# Patient Record
Sex: Male | Born: 1960 | Race: White | Hispanic: No | Marital: Married | Smoking: Never smoker
Health system: Southern US, Community
[De-identification: ages and names within clinical notes are randomized; demographics above are authoritative.]

## PROBLEM LIST (undated history)

## (undated) DIAGNOSIS — J302 Other seasonal allergic rhinitis: Secondary | ICD-10-CM

## (undated) DIAGNOSIS — I2510 Atherosclerotic heart disease of native coronary artery without angina pectoris: Secondary | ICD-10-CM

## (undated) HISTORY — PX: SURGERY SCROTAL / TESTICULAR: SUR1316

## (undated) HISTORY — DX: Other seasonal allergic rhinitis: J30.2

## (undated) HISTORY — DX: Atherosclerotic heart disease of native coronary artery without angina pectoris: I25.10

---

## 2017-12-23 DIAGNOSIS — H6593 Unspecified nonsuppurative otitis media, bilateral: Secondary | ICD-10-CM | POA: Diagnosis not present

## 2017-12-23 DIAGNOSIS — H9203 Otalgia, bilateral: Secondary | ICD-10-CM | POA: Diagnosis not present

## 2018-01-19 DIAGNOSIS — Z131 Encounter for screening for diabetes mellitus: Secondary | ICD-10-CM | POA: Diagnosis not present

## 2018-01-19 DIAGNOSIS — Z125 Encounter for screening for malignant neoplasm of prostate: Secondary | ICD-10-CM | POA: Diagnosis not present

## 2018-01-19 DIAGNOSIS — Z136 Encounter for screening for cardiovascular disorders: Secondary | ICD-10-CM | POA: Diagnosis not present

## 2018-01-19 DIAGNOSIS — Z0000 Encounter for general adult medical examination without abnormal findings: Secondary | ICD-10-CM | POA: Diagnosis not present

## 2018-09-23 DIAGNOSIS — R079 Chest pain, unspecified: Secondary | ICD-10-CM | POA: Diagnosis not present

## 2018-10-12 ENCOUNTER — Telehealth: Payer: Self-pay

## 2018-10-12 NOTE — Telephone Encounter (Signed)
SENT REFERRAL TO SCHEDULING AND FILED NOTES 

## 2018-10-13 NOTE — Progress Notes (Signed)
Chief Complaint  Patient presents with  . New Patient (Initial Visit)   History of Present Illness: 57 yo male with no past medical history here today as a new consult, referred by Dr. Joycelyn Rua for evaluation of chest pain. He has no known cardiac issues. He has been healthy and only takes Flonase for seasonal allergies. He is very active and rides his bike several days per week. His father had an MI at age 56. He has several uncles with CAD. He tells me that he has had chest pain for years. Usually it is sharp and lasts for a few seconds with no associated symptoms. One month ago he had an episode of substernal, sharp chest pain that lasted for 5 minutes. He felt dizzy and had diaphoresis. No dyspnea. This occurred at rest. He has had no exertional chest pain prior to this episode or since. He has never smoked.    Primary Care Physician: Joycelyn Rua, MD  Past Medical History:  Diagnosis Date  . Seasonal allergies     Past Surgical History:  Procedure Laterality Date  . SURGERY SCROTAL / TESTICULAR      Current Outpatient Medications  Medication Sig Dispense Refill  . fluticasone (FLONASE) 50 MCG/ACT nasal spray Place 1 spray into both nostrils daily.     No current facility-administered medications for this visit.     Allergies  Allergen Reactions  . Codeine     Social History   Socioeconomic History  . Marital status: Married    Spouse name: Not on file  . Number of children: 2  . Years of education: Not on file  . Highest education level: Not on file  Occupational History  . Occupation: Paediatric nurse  . Financial resource strain: Not on file  . Food insecurity:    Worry: Not on file    Inability: Not on file  . Transportation needs:    Medical: Not on file    Non-medical: Not on file  Tobacco Use  . Smoking status: Never Smoker  . Smokeless tobacco: Never Used  Substance and Sexual Activity  . Alcohol use: Yes    Comment: occasionally  .  Drug use: Never  . Sexual activity: Not on file    Comment: married  Lifestyle  . Physical activity:    Days per week: Not on file    Minutes per session: Not on file  . Stress: Not on file  Relationships  . Social connections:    Talks on phone: Not on file    Gets together: Not on file    Attends religious service: Not on file    Active member of club or organization: Not on file    Attends meetings of clubs or organizations: Not on file    Relationship status: Not on file  . Intimate partner violence:    Fear of current or ex partner: Not on file    Emotionally abused: Not on file    Physically abused: Not on file    Forced sexual activity: Not on file  Other Topics Concern  . Not on file  Social History Narrative  . Not on file    Family History  Problem Relation Age of Onset  . Heart attack Father 19  . Lymphoma Sister   . Other Sister        tumor around the heart  . Alzheimer's disease Mother     Review of Systems:  As stated in the HPI  and otherwise negative.   BP 108/70   Pulse (!) 50   Ht 6\' 1"  (1.854 m)   Wt 181 lb 12.8 oz (82.5 kg)   SpO2 98%   BMI 23.99 kg/m   Physical Examination: General: Well developed, well nourished, NAD  HEENT: OP clear, mucus membranes moist  SKIN: warm, dry. No rashes. Neuro: No focal deficits  Musculoskeletal: Muscle strength 5/5 all ext  Psychiatric: Mood and affect normal  Neck: No JVD, no carotid bruits, no thyromegaly, no lymphadenopathy.  Lungs:Clear bilaterally, no wheezes, rhonci, crackles Cardiovascular: Regular rate and rhythm. No murmurs, gallops or rubs. Abdomen:Soft. Bowel sounds present. Non-tender.  Extremities: No lower extremity edema. Pulses are 2 + in the bilateral DP/PT.  EKG:  EKG is ordered today. The ekg ordered today demonstrates Sinus bradycardia, rate 50 bpm.   Recent Labs: No results found for requested labs within last 8760 hours.   Lipid Panel No results found for: CHOL, TRIG, HDL,  CHOLHDL, VLDL, LDLCALC, LDLDIRECT   Wt Readings from Last 3 Encounters:  10/14/18 181 lb 12.8 oz (82.5 kg)     Other studies Reviewed: Additional studies/ records that were reviewed today include: . Review of the above records demonstrates:    Assessment and Plan:   1. Chest pain: His pain has mostly atypical features. Risk factors for CAD include FH of premature CAD. Will arrange coronary CTA to exclude CAD. Will arrange echo to assess LVEF and exclude structural heart disease.   Current medicines are reviewed at length with the patient today.  The patient does not have concerns regarding medicines.  The following changes have been made:  no change  Labs/ tests ordered today include:   Orders Placed This Encounter  Procedures  . CT CORONARY MORPH W/CTA COR W/SCORE W/CA W/CM &/OR WO/CM  . CT CORONARY FRACTIONAL FLOW RESERVE DATA PREP  . CT CORONARY FRACTIONAL FLOW RESERVE FLUID ANALYSIS  . Basic Metabolic Panel (BMET)  . EKG 12-Lead  . ECHOCARDIOGRAM COMPLETE     Disposition:   FU with me  in 8 weeks   Signed, Verne Carrowhristopher Alyanna Stoermer, MD 10/14/2018 9:48 AM    [facility] [address], [place], [place]  [address] Phone: [phone]; Fax: [phone]

## 2018-10-14 ENCOUNTER — Ambulatory Visit (INDEPENDENT_AMBULATORY_CARE_PROVIDER_SITE_OTHER): Payer: 59 | Admitting: Cardiovascular Disease

## 2018-10-14 ENCOUNTER — Encounter (INDEPENDENT_AMBULATORY_CARE_PROVIDER_SITE_OTHER): Payer: Self-pay

## 2018-10-14 ENCOUNTER — Encounter: Payer: Self-pay | Admitting: Cardiovascular Disease

## 2018-10-14 VITALS — BP 108/70 | HR 50 | Ht 73.0 in | Wt 181.8 lb

## 2018-10-14 DIAGNOSIS — R072 Precordial pain: Secondary | ICD-10-CM | POA: Diagnosis not present

## 2018-10-14 LAB — BASIC METABOLIC PANEL
BUN/Creatinine Ratio: 12 (ref 9–20)
BUN: 12 mg/dL (ref 6–24)
CO2: 25 mmol/L (ref 20–29)
CREATININE: 1 mg/dL (ref 0.76–1.27)
Calcium: 9.5 mg/dL (ref 8.7–10.2)
Chloride: 99 mmol/L (ref 96–106)
GFR calc non Af Amer: 83 mL/min/{1.73_m2} (ref >59)
GFR, EST AFRICAN AMERICAN: 95 mL/min/{1.73_m2} (ref >59)
Glucose: 86 mg/dL (ref 65–99)
Potassium: 4.4 mmol/L (ref 3.5–5.2)
Sodium: 138 mmol/L (ref 134–144)

## 2018-10-14 NOTE — Patient Instructions (Addendum)
Medication Instructions:  Your physician recommends that you continue on your current medications as directed. Please refer to the Current Medication list given to you today.  If you need a refill on your cardiac medications before your next appointment, please call your pharmacy.   Lab work: Lab work to be done today--BMP If you have labs (blood work) drawn today and your tests are completely normal, you will receive your results only by: Marland Kitchen. MyChart Message (if you have MyChart) OR . A paper copy in the mail If you have any lab test that is abnormal or we need to change your treatment, we will call you to review the results.  Testing/Procedures: Your physician has requested that you have an echocardiogram. Echocardiography is a painless test that uses sound waves to create images of your heart. It provides your doctor with information about the size and shape of your heart and how well your heart's chambers and valves are working. This procedure takes approximately one hour. There are no restrictions for this procedure.  Your physician has requested that you have cardiac CT. Cardiac computed tomography (CT) is a painless test that uses an x-ray machine to take clear, detailed pictures of your heart. For further information please visit [email or web address]. Please follow instruction sheet as given.    Follow-Up  Your physician recommends that you schedule a follow-up appointment VH:QIONGin:about 8 weeks. --Scheduled for January 30,2020 at 2:00    Any Other Special Instructions Will Be Listed Below (If Applicable). Please [facility] the [facility] at   AM (30-45 minutes prior to test start time)  [facility]  [address] [place], [place] [address] [phone]  Proceed to the [facility] Rad[facility] Department (First Floor).  Please follow these instructions carefully (unless otherwise directed):  Hold all erectile dysfunction medications  at least 48 hours prior to test.  On the Night Before the Test: . Be sure to Drink plenty of water. . Do not consume any caffeinated/decaffeinated beverages or chocolate 12 hours prior to your test. . Do not take any antihistamines 12 hours prior to your test. . If you take Metformin do not take 24 hours prior to test.   On the Day of the Test: . Drink plenty of water. Do not drink any water within one hour of the test. . Do not eat any food 4 hours prior to the test. . You may take your regular medications prior to the test.          After the Test: . Drink plenty of water. . After receiving IV contrast, you may experience a mild flushed feeling. This is normal. . On occasion, you may experience a mild rash up to 24 hours after the test. This is not dangerous. If this occurs, you can take Benadryl 25 mg and increase your fluid intake. . If you experience trouble breathing, this can be serious. If it is severe call 911 IMMEDIATELY. If it is mild, please call our office. . If you take any of these medications: Glipizide/Metformin, Avandament, Glucavance, please do not take 48 hours after completing test.

## 2018-10-16 ENCOUNTER — Ambulatory Visit (HOSPITAL_COMMUNITY): Payer: 59 | Attending: Cardiology

## 2018-10-16 ENCOUNTER — Other Ambulatory Visit: Payer: Self-pay

## 2018-10-16 DIAGNOSIS — R072 Precordial pain: Secondary | ICD-10-CM | POA: Diagnosis present

## 2018-11-18 ENCOUNTER — Telehealth (HOSPITAL_COMMUNITY): Payer: Self-pay | Admitting: Emergency Medicine

## 2018-11-18 NOTE — Telephone Encounter (Signed)
Reaching out to patient to offer assistance regarding upcoming cardiac imaging study; pt verbalizes understanding of appt date/time, parking situation and where to check in, pre-test NPO status and medications ordered, and verified current allergies; name and call back number provided for further questions should they arise Loyola Santino RN Navigator Cardiac Imaging [phone] 

## 2018-11-20 ENCOUNTER — Ambulatory Visit (HOSPITAL_COMMUNITY)
Admission: RE | Admit: 2018-11-20 | Discharge: 2018-11-20 | Disposition: A | Discharge status: Home / Self Care | Payer: 59 | Source: Ambulatory Visit | Attending: Cardiovascular Disease | Admitting: Cardiovascular Disease

## 2018-11-20 ENCOUNTER — Ambulatory Visit (HOSPITAL_COMMUNITY): Admission: RE | Admit: 2018-11-20 | Payer: 59 | Source: Ambulatory Visit

## 2018-11-20 DIAGNOSIS — R072 Precordial pain: Secondary | ICD-10-CM | POA: Insufficient documentation

## 2018-11-20 DIAGNOSIS — Z006 Encounter for examination for normal comparison and control in clinical research program: Secondary | ICD-10-CM

## 2018-11-20 MED ORDER — NITROGLYCERIN 0.4 MG SL SUBL
0.8000 mg | SUBLINGUAL_TABLET | Freq: Once | SUBLINGUAL | Status: AC
  Administered 2018-11-20: 0.8 mg via SUBLINGUAL

## 2018-11-20 MED ORDER — IOPAMIDOL (ISOVUE-370) INJECTION 76%
INTRAVENOUS | Status: AC
  Administered 2018-11-20: 80 mL via INTRAVENOUS
  Filled 2018-11-20: qty 100

## 2018-11-20 MED ORDER — NITROGLYCERIN 0.4 MG SL SUBL
SUBLINGUAL_TABLET | SUBLINGUAL | Status: AC
  Filled 2018-11-20: qty 2

## 2018-11-20 NOTE — Research (Signed)
Cody Mcmahon met inclusion and exclusion criteria.  The informed consent form, study requirements and expectations were reviewed with the subject and questions and concerns were addressed prior to the signing of the consent form.  The subject verbalized understanding of the trial requirements.  The subject agreed to participate in the CADFEM trial and signed the informed consent.  The informed consent was obtained prior to performance of any protocol-specific procedures for the subject.  A copy of the signed informed consent was given to the subject and a copy was placed in the subject's medical record.   Angela M. Smith Research Assistant 

## 2018-11-23 ENCOUNTER — Telehealth: Payer: Self-pay | Admitting: *Deleted

## 2018-11-23 MED ORDER — ASPIRIN EC 81 MG PO TBEC: 81.0000 mg | DELAYED_RELEASE_TABLET | Freq: Every day | ORAL | 3 refills | Status: AC

## 2018-11-23 NOTE — Telephone Encounter (Signed)
-----   Message from Kathleene Hazel, MD sent at 11/23/2018 11:30 AM EST ----- Very mild plaque in the coronary arteries. I would recommend starting ASA 81 mg daily and Crestor 5 mg daily. He will need a fasting lipid profile and LFTs in 12 weeks. chris

## 2018-11-23 NOTE — Telephone Encounter (Signed)
I spoke with pt and reviewed results and recommendations from Dr. Clifton James with him.  Pt would like to talk with Dr. Clifton James about having lab work done prior to starting Rosuvastatin.  He has office visit on December 10, 2018 and will talk with Dr. Clifton James at that time.

## 2018-12-10 ENCOUNTER — Ambulatory Visit (INDEPENDENT_AMBULATORY_CARE_PROVIDER_SITE_OTHER): Payer: 59 | Admitting: Cardiovascular Disease

## 2018-12-10 ENCOUNTER — Encounter: Payer: Self-pay | Admitting: Cardiovascular Disease

## 2018-12-10 VITALS — BP 113/68 | HR 63 | Ht 73.0 in | Wt 182.0 lb

## 2018-12-10 DIAGNOSIS — I2510 Atherosclerotic heart disease of native coronary artery without angina pectoris: Secondary | ICD-10-CM | POA: Diagnosis not present

## 2018-12-10 MED ORDER — ROSUVASTATIN CALCIUM 5 MG PO TABS: 5.0000 mg | ORAL_TABLET | Freq: Every day | ORAL | 3 refills | Status: DC

## 2018-12-10 NOTE — Progress Notes (Signed)
Chief Complaint  Patient presents with  . Follow-up    chest pain   History of Present Illness: 58 yo male with no past medical history who I saw as a new consult 10/14/18 for evaluation of chest pain. No known heart issues. He is very active and rides his bike several days per week. His father had an MI at age 59. He has several uncles with CAD. He tells me that he has had chest pain for years. Usually it is sharp and lasts for a few seconds with no associated symptoms. One month ago he had an episode of substernal, sharp chest pain that lasted for 5 minutes. He felt dizzy and had diaphoresis. No dyspnea. This occurred at rest. He has had no exertional chest pain prior to this episode or since. He has never smoked.  Coronary CTA 11/20/18 with mild plaque in the LAD. No plaque in the RCA or Circumflex. Calcium score of 25. Echo 10/16/18 with LVEF=55-60%. Right atrial enlargement. No significant valve disease.   He is here today for follow up. The patient denies any chest pain, dyspnea, palpitations, lower extremity edema, orthopnea, PND, dizziness, near syncope or syncope.    Primary Care Physician: Joycelyn RuaMeyers, Stephen, MD  Past Medical History:  Diagnosis Date  . CAD (coronary artery disease)    Mild plaque in LAD on coronary CTA January 2020  . Seasonal allergies     Past Surgical History:  Procedure Laterality Date  . SURGERY SCROTAL / TESTICULAR      Current Outpatient Medications  Medication Sig Dispense Refill  . aspirin EC 81 MG tablet Take 1 tablet (81 mg total) by mouth daily. 90 tablet 3  . fluticasone (FLONASE) 50 MCG/ACT nasal spray Place 1 spray into both nostrils daily.    . rosuvastatin (CRESTOR) 5 MG tablet Take 1 tablet (5 mg total) by mouth daily. 90 tablet 3   No current facility-administered medications for this visit.     Allergies  Allergen Reactions  . Codeine     Social History   Socioeconomic History  . Marital status: Married    Spouse name: Not on  file  . Number of children: 2  . Years of education: Not on file  . Highest education level: Not on file  Occupational History  . Occupation: Paediatric nursengineer  Social Needs  . Financial resource strain: Not on file  . Food insecurity:    Worry: Not on file    Inability: Not on file  . Transportation needs:    Medical: Not on file    Non-medical: Not on file  Tobacco Use  . Smoking status: Never Smoker  . Smokeless tobacco: Never Used  Substance and Sexual Activity  . Alcohol use: Yes    Comment: occasionally  . Drug use: Never  . Sexual activity: Not on file    Comment: married  Lifestyle  . Physical activity:    Days per week: Not on file    Minutes per session: Not on file  . Stress: Not on file  Relationships  . Social connections:    Talks on phone: Not on file    Gets together: Not on file    Attends religious service: Not on file    Active member of club or organization: Not on file    Attends meetings of clubs or organizations: Not on file    Relationship status: Not on file  . Intimate partner violence:    Fear of current or ex partner:  Not on file    Emotionally abused: Not on file    Physically abused: Not on file    Forced sexual activity: Not on file  Other Topics Concern  . Not on file  Social History Narrative  . Not on file    Family History  Problem Relation Age of Onset  . Heart attack Father 7260  . Lymphoma Sister   . Other Sister        tumor around the heart  . Alzheimer's disease Mother     Review of Systems:  As stated in the HPI and otherwise negative.   BP 113/68   Pulse 63   Ht 6\' 1"  (1.854 m)   Wt 182 lb (82.6 kg)   SpO2 98%   BMI 24.01 kg/m   Physical Examination: General: Well developed, well nourished, NAD  HEENT: OP clear, mucus membranes moist  SKIN: warm, dry. No rashes. Neuro: No focal deficits  Musculoskeletal: Muscle strength 5/5 all ext  Psychiatric: Mood and affect normal  Neck: No JVD, no carotid bruits, no  thyromegaly, no lymphadenopathy.  Lungs:Clear bilaterally, no wheezes, rhonci, crackles Cardiovascular: Regular rate and rhythm. No murmurs, gallops or rubs. Abdomen:Soft. Bowel sounds present. Non-tender.  Extremities: No lower extremity edema. Pulses are 2 + in the bilateral DP/PT.  Echo 10/16/18: Left ventricle: The cavity size was mildly dilated. Systolic   function was normal. The estimated ejection fraction was in the   range of 55% to 60%. Wall motion was normal; there were no   regional wall motion abnormalities. Features are consistent with   a pseudonormal left ventricular filling pattern, with concomitant   abnormal relaxation and increased filling pressure (grade 2   diastolic dysfunction). GLS: -22.7% - Mitral valve: Mild prolapse, involving the anterior leaflet. - Right ventricle: The cavity size was mildly dilated. Wall   thickness was normal. - Right atrium: The atrium was severely dilated. - Tricuspid valve: There was mild regurgitation.   EKG:  EKG is not ordered today. The ekg ordered today demonstrates   Recent Labs: 10/14/2018: BUN 12; Creatinine, Ser 1.00; Potassium 4.4; Sodium 138   Lipid Panel No results found for: CHOL, TRIG, HDL, CHOLHDL, VLDL, LDLCALC, LDLDIRECT   Wt Readings from Last 3 Encounters:  12/10/18 182 lb (82.6 kg)  10/14/18 181 lb 12.8 oz (82.5 kg)     Other studies Reviewed: Additional studies/ records that were reviewed today include: . Review of the above records demonstrates:    Assessment and Plan:   1. CAD/Chest pain: Mild plaque in the LAD noted on coronary CTA. LV function normal on echo. His chest pain is not felt to be cardiac related.  Given mild CAD noted on Coronary CTA, I will have him start ASA 81 mg daily and Crestor 5 mg daily. Fasting lipids and LFTS in 12 weeks.   Current medicines are reviewed at length with the patient today.  The patient does not have concerns regarding medicines.  The following changes have  been made:  no change  Labs/ tests ordered today include:   Orders Placed This Encounter  Procedures  . Lipid panel  . Hepatic function panel     Disposition:   FU with me  in 12 months    Signed, Verne Carrowhristopher Keirsten Matuska, MD 12/10/2018 2:28 PM    [facility] [address], [place], [place]  [address] Phone: [phone]; Fax: [phone]

## 2018-12-10 NOTE — Patient Instructions (Signed)
Medication Instructions:  1) START Rosuvastatin 5mg  once daily  If you need a refill on your cardiac medications before your next appointment, please call your pharmacy.   Lab work: Your physician recommends that you return for lab work in: 12 weeks (Lipid, liver)  If you have labs (blood work) drawn today and your tests are completely normal, you will receive your results only by: Marland Kitchen MyChart Message (if you have MyChart) OR . A paper copy in the mail If you have any lab test that is abnormal or we need to change your treatment, we will call you to review the results.  Testing/Procedures: None  Follow-Up: At [facility], you and your health needs are our priority.  As part of our continuing mission to provide you with exceptional heart care, we have created designated Provider Care Teams.  These Care Teams include your primary Cardiologist (physician) and Advanced Practice Providers (APPs -  Physician Assistants and Nurse Practitioners) who all work together to provide you with the care you need, when you need it. You will need a follow up appointment in 12 months.  Please call our office 2 months in advance to schedule this appointment.  You may see Verne Carrow, MD or one of the following Advanced Practice Providers on your designated Care Team:   [place], PA-C Ronie Spies, PA-C . Jacolyn Reedy, PA-C  Any Other Special Instructions Will Be Listed Below (If Applicable).

## 2018-12-14 DIAGNOSIS — E7800 Pure hypercholesterolemia, unspecified: Secondary | ICD-10-CM | POA: Diagnosis not present

## 2018-12-24 DIAGNOSIS — J019 Acute sinusitis, unspecified: Secondary | ICD-10-CM | POA: Diagnosis not present

## 2019-02-15 IMAGING — CT CT HEART MORP W/ CTA COR W/ SCORE W/ CA W/CM &/OR W/O CM
4 of 7 series · 8 of 20 positions shown, 9 images · IV contrast (APPLIED)
Comparison: None.

Addendum:
EXAM:
OVER-READ INTERPRETATION  CT CHEST

The following report is an over-read performed by radiologist Dr.
Gyrit Swaak [REDACTED] on 11/20/2018. This
over-read does not include interpretation of cardiac or coronary
anatomy or pathology. The coronary calcium score/coronary CTA
interpretation by the cardiologist is attached.
CLINICAL DATA: Chest pain
Cardiac CTA
MEDICATIONS:
Sub lingual nitro. 4mg x 2
TECHNIQUE: The patient was scanned on a Siemens [REDACTED]ice scanner. Gantry
rotation speed was 250 msecs. Collimation was 0.6 mm. A 100 kV
prospective scan was triggered in the ascending thoracic aorta at
35-75% of the R-R interval. Average HR during the scan was 60 bpm.
The 3D data set was interpreted on a dedicated work station using
MPR, MIP and VRT modes. A total of 80cc of contrast was used.

[Series 6: best diast 76 % · axial · 0.36mm/px · z∈[+1160,+1215]mm · 2 of 412 slices shown, 3 images]
[im 138/412  vessel]
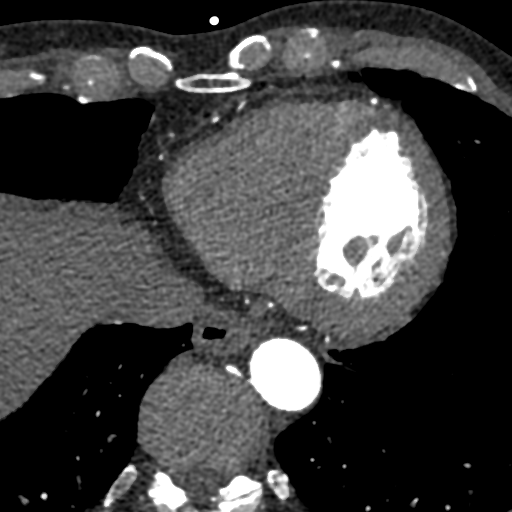
[im 138/412  lung]
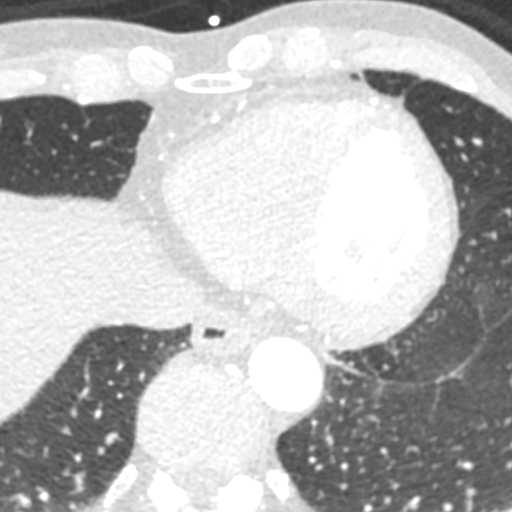
[im 275/412  vessel]
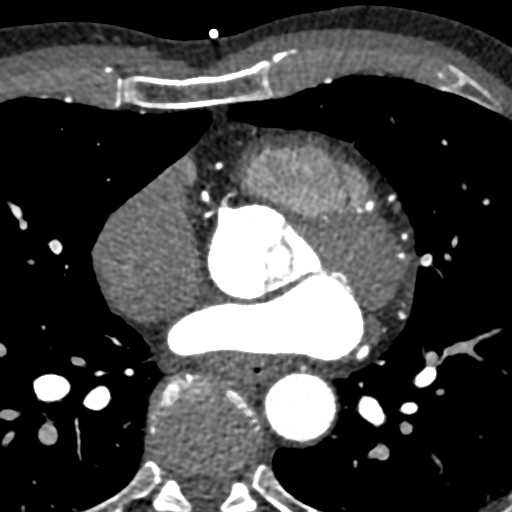

[Series 7: best syst 30 % · axial · 0.36mm/px · z∈[+1160,+1215]mm · 2 of 412 slices shown]
[im 138/412  vessel]
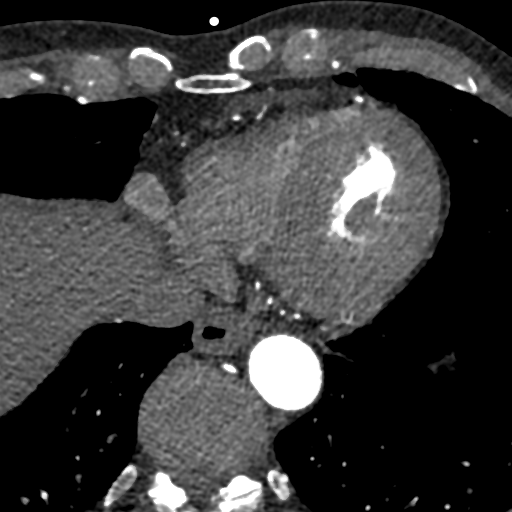
[im 275/412  vessel]
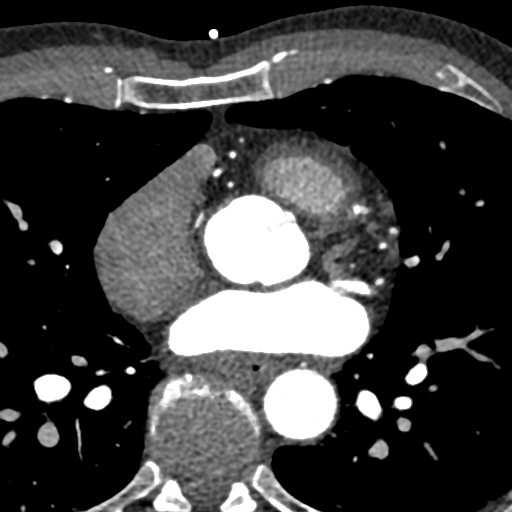

[Series 8: ts diast sharp 76 % · axial · 0.36mm/px · z∈[+1160,+1215]mm · 2 of 412 slices shown]
[im 138/412  lung]
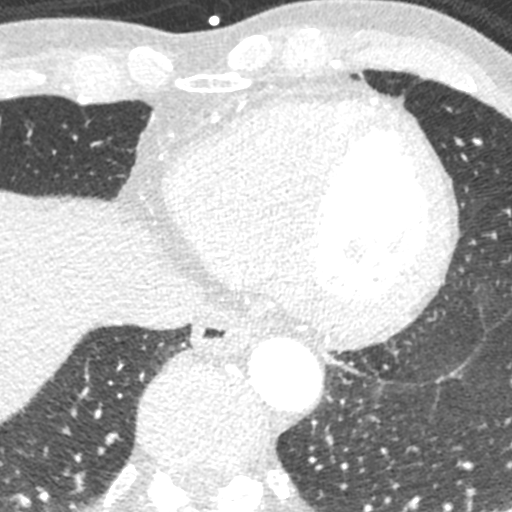
[im 275/412  lung]
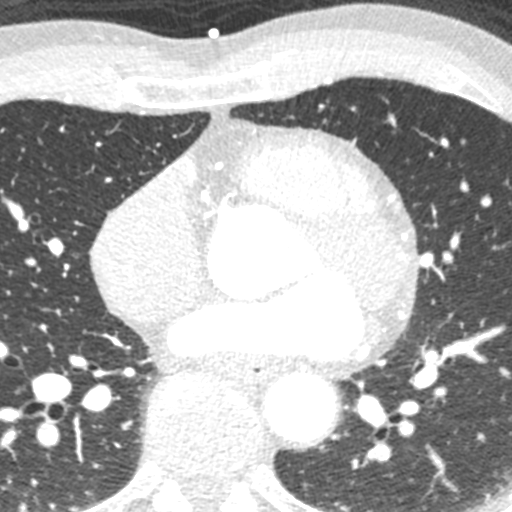

[Series 9: ts syst sharp 30 % · axial · 0.36mm/px · z∈[+1160,+1215]mm · 2 of 412 slices shown]
[im 138/412  lung]
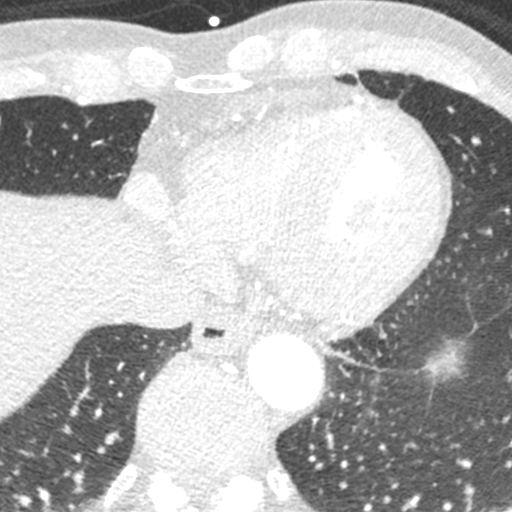
[im 275/412  lung]
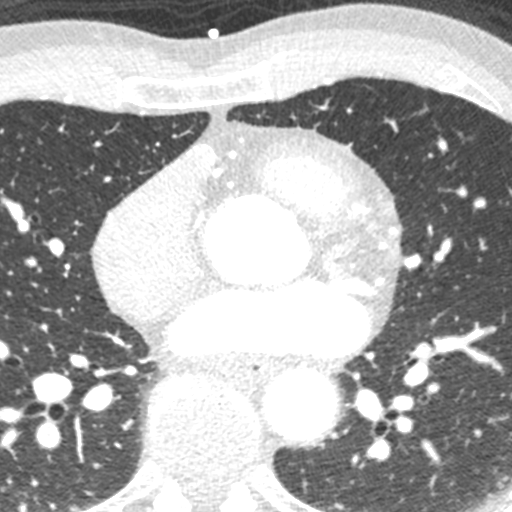

[8 of 20 positions shown; findings below may reference images not displayed]

FINDINGS: Within the visualized portions of the thorax there are no suspicious
appearing pulmonary nodules or masses, there is no acute
consolidative airspace disease, no pleural effusions, no
pneumothorax and no lymphadenopathy. Prominent but nonenlarged right
hilar lymph nodes are incidentally noted (nonspecific). Visualized
portions of the upper abdomen are unremarkable. There are no
aggressive appearing lytic or blastic lesions noted in the
visualized portions of the skeleton.
IMPRESSION: No significant incidental noncardiac findings are noted.
FINDINGS: Non-cardiac: See separate report from [REDACTED].

Pulmonary veins drain normally to the left atrium.

Calcium Score: 25 Agatston units.

Coronary Arteries: Left dominant with no anomalies

LM: No plaque or stenosis.

LAD system: Mixed plaque proximal LAD with mild stenosis (<50%).
Calcified plaque at the ostium of a moderate 1st diagonal, mild
(<50%) stenosis.

Circumflex system: Large vessel providing left PDA. No plaque or
stenosis.

RCA system: Small, nondominant vessel.  No plaque or stenosis.
IMPRESSION: 1.Coronary artery calcium score 25 Agatston units. This places the
patient in the 53rd percentile for age and gender, suggesting
intermediate risk for future cardiac events.

2.  Nonobstructive CAD.

Jiovahny Baroulette

*** End of Addendum ***

## 2019-03-26 ENCOUNTER — Other Ambulatory Visit: Payer: 59 | Admitting: *Deleted

## 2019-03-26 ENCOUNTER — Encounter (INDEPENDENT_AMBULATORY_CARE_PROVIDER_SITE_OTHER): Payer: Self-pay

## 2019-03-26 ENCOUNTER — Other Ambulatory Visit: Payer: Self-pay

## 2019-03-26 DIAGNOSIS — I2510 Atherosclerotic heart disease of native coronary artery without angina pectoris: Secondary | ICD-10-CM

## 2019-03-26 LAB — HEPATIC FUNCTION PANEL
ALT: 31 IU/L (ref 0–44)
AST: 27 IU/L (ref 0–40)
Albumin: 4.9 g/dL (ref 3.8–4.9)
Alkaline Phosphatase: 37 IU/L — ABNORMAL LOW (ref 39–117)
Bilirubin Total: 0.9 mg/dL (ref 0.0–1.2)
Bilirubin, Direct: 0.23 mg/dL (ref 0.00–0.40)
Total Protein: 7.7 g/dL (ref 6.0–8.5)

## 2019-03-31 DIAGNOSIS — I2510 Atherosclerotic heart disease of native coronary artery without angina pectoris: Secondary | ICD-10-CM

## 2019-03-31 DIAGNOSIS — E7849 Other hyperlipidemia: Secondary | ICD-10-CM

## 2019-03-31 LAB — SPECIMEN STATUS REPORT

## 2019-03-31 LAB — LIPID PANEL W/O CHOL/HDL RATIO
Cholesterol, Total: 196 mg/dL (ref 100–199)
HDL: 85 mg/dL (ref >39)
LDL Calculated: 95 mg/dL (ref 0–99)
Triglycerides: 78 mg/dL (ref 0–149)
VLDL Cholesterol Cal: 16 mg/dL (ref 5–40)

## 2019-04-01 MED ORDER — ROSUVASTATIN CALCIUM 10 MG PO TABS: 10.0000 mg | ORAL_TABLET | Freq: Every day | ORAL | 3 refills | Status: AC

## 2019-06-24 ENCOUNTER — Other Ambulatory Visit: Payer: 59 | Admitting: *Deleted

## 2019-06-24 ENCOUNTER — Other Ambulatory Visit: Payer: Self-pay

## 2019-06-24 DIAGNOSIS — E7849 Other hyperlipidemia: Secondary | ICD-10-CM

## 2019-06-24 DIAGNOSIS — I2510 Atherosclerotic heart disease of native coronary artery without angina pectoris: Secondary | ICD-10-CM

## 2019-06-24 LAB — HEPATIC FUNCTION PANEL
ALT: 19 IU/L (ref 0–44)
AST: 24 IU/L (ref 0–40)
Albumin: 4.7 g/dL (ref 3.8–4.9)
Alkaline Phosphatase: 32 IU/L — ABNORMAL LOW (ref 39–117)
Bilirubin Total: 0.7 mg/dL (ref 0.0–1.2)
Bilirubin, Direct: 0.19 mg/dL (ref 0.00–0.40)
Total Protein: 7.4 g/dL (ref 6.0–8.5)

## 2019-06-24 LAB — LIPID PANEL
Chol/HDL Ratio: 2 ratio (ref 0.0–5.0)
Cholesterol, Total: 177 mg/dL (ref 100–199)
HDL: 88 mg/dL (ref >39)
LDL Calculated: 78 mg/dL (ref 0–99)
Triglycerides: 53 mg/dL (ref 0–149)
VLDL Cholesterol Cal: 11 mg/dL (ref 5–40)

## 2019-11-23 ENCOUNTER — Ambulatory Visit (INDEPENDENT_AMBULATORY_CARE_PROVIDER_SITE_OTHER): Payer: Commercial Managed Care - PPO | Admitting: Family Medicine

## 2019-11-23 ENCOUNTER — Encounter: Payer: Self-pay | Admitting: Family Medicine

## 2019-11-23 ENCOUNTER — Other Ambulatory Visit: Payer: Self-pay

## 2019-11-23 DIAGNOSIS — S62525B Nondisplaced fracture of distal phalanx of left thumb, initial encounter for open fracture: Secondary | ICD-10-CM

## 2019-11-23 DIAGNOSIS — S61012A Laceration without foreign body of left thumb without damage to nail, initial encounter: Secondary | ICD-10-CM | POA: Diagnosis not present

## 2019-11-23 NOTE — Progress Notes (Signed)
Cody Mcmahon - 59 y.o. male MRN [id number]  Date of birth: 10-28-1961  Office Visit Note: Visit Date: 11/23/2019 PCP: Orpah Melter, MD Referred by: Orpah Melter, MD  Subjective: Chief Complaint  Patient presents with  . Left Thumb - Pain, Injury    Sawblade laceration 11/21/19 - was seen at [facility] urgent care in [place]. Sutured and bandaged after taking xrays.   HPI: Cody Mcmahon is a 59 y.o. male who comes in today with injury to left thumb after he cut his thumb on a table saw saw on 11/21/2019. He sustained tuft fracture with laceration of his left thumb. He was seen at [facility] ED in [place]. Laceration repaired with 5 sutures and he was prescribed 10 days keflex. He received Tdap vaccination in the ED.  Pain has been well tolerated without medications. Able to move thumb. No change in sensation.  ROS Otherwise per HPI.  Assessment & Plan: Visit Diagnoses:  1. Laceration of left thumb without foreign body without damage to nail, initial encounter   2. Nondisplaced fracture of distal phalanx of left thumb, initial encounter for open fracture    Laceration s/p repair, well healing with no sign of infection or tendon involvement. Return in 1 week for suture removal, repeat x-ray to ensure appropriate alignment of fracture  Meds & Orders: No orders of the defined types were placed in this encounter.  No orders of the defined types were placed in this encounter.   Follow-up: Return in about 1 week (around 11/30/2019).   Procedures: No procedures performed  No notes on file   Clinical History: No specialty comments available.   He reports that he has never smoked. He has never used smokeless tobacco. No results for input(s): HGBA1C, LABURIC in the last 8760 hours.  Objective:  VS:  HT:    WT:   BMI:     BP:   HR: bpm  TEMP: ( )  RESP:  Physical Exam  PHYSICAL EXAM: Gen: NAD, alert, cooperative with exam, well-appearing HEENT: clear conjunctiva,  CV:  no  edema, capillary refill brisk, normal rate Resp: non-labored Skin: no rashes, normal turgor  Neuro: no gross deficits.  Psych:  alert and oriented  Ortho Exam  Inspection: L thumb with 5 sutures on volar and ulnar surface of distal phalanx, well healing. Surrounding area is pink. No drainage Palpation: did not palpate over repaired laceration.  ROM: able to fully bend and flex distal thumb Strength: flexor and extensor tendons intact Sensation intact. Thumb neurovascularly intact  Imaging: No results found.  Past Medical/Family/Surgical/Social History: Medications & Allergies reviewed per EMR, new medications updated. There are no problems to display for this patient.  Past Medical History:  Diagnosis Date  . CAD (coronary artery disease)    Mild plaque in LAD on coronary CTA January 2020  . Seasonal allergies    Family History  Problem Relation Age of Onset  . Heart attack Father 64  . Lymphoma Sister   . Other Sister        tumor around the heart  . Alzheimer's disease Mother    Past Surgical History:  Procedure Laterality Date  . SURGERY SCROTAL / TESTICULAR     Social History   Occupational History  . Occupation: Chief Financial Officer  Tobacco Use  . Smoking status: Never Smoker  . Smokeless tobacco: Never Used  Substance and Sexual Activity  . Alcohol use: Yes    Comment: occasionally  . Drug use: Never  . Sexual activity: Not  on file    Comment: married

## 2019-11-23 NOTE — Progress Notes (Signed)
I saw and examined the patient with Dr. Robby Sermon and agree with assessment and plan as outlined.    Open fracture left thumb distal phalanx (per x-ray report).  Wound looks good today, sutures in place.  Will bandage, continue keflex, start vitamin D3 and K2.    Return in 1 week for suture removal and 2-view x-rays to assess alignment.

## 2019-11-23 NOTE — Patient Instructions (Signed)
    Bones:   - Vitamin D3:  5,000 IU daily  - Vitamin K2:  100 mcg daily

## 2019-11-30 ENCOUNTER — Ambulatory Visit (INDEPENDENT_AMBULATORY_CARE_PROVIDER_SITE_OTHER): Payer: Commercial Managed Care - PPO | Admitting: Family Medicine

## 2019-11-30 ENCOUNTER — Other Ambulatory Visit: Payer: Self-pay

## 2019-11-30 ENCOUNTER — Ambulatory Visit: Payer: Self-pay

## 2019-11-30 ENCOUNTER — Encounter: Payer: Self-pay | Admitting: Family Medicine

## 2019-11-30 DIAGNOSIS — S62525B Nondisplaced fracture of distal phalanx of left thumb, initial encounter for open fracture: Secondary | ICD-10-CM

## 2019-11-30 NOTE — Progress Notes (Signed)
   Office Visit Note   Patient: Cody Mcmahon           Date of Birth: 15-Feb-1961           MRN: [id number] Visit Date: 11/30/2019 Requested by: Joycelyn Rua, MD [address] [place],  [place] [address] PCP: Joycelyn Rua, MD  Subjective: Chief Complaint  Patient presents with  . Left Thumb - Pain, Injury, Follow-up    DOI 11/21/19 Feeling better. Sutures are still intact. Been keeping the thumb wrapped.    HPI: He is here for follow-up 9 days status post left thumb laceration and tuft fracture.  Doing well overall.              ROS: No fevers or chills.  All other systems were reviewed and are negative.  Objective: Vital Signs: There were no vitals taken for this visit.  Physical Exam:  General:  Alert and oriented, in no acute distress. Pulm:  Breathing unlabored. Psy:  Normal mood, congruent affect. Skin: Wound is healing nicely, no drainage, no erythema.  Sutures were removed. Left thumb IP motion is good.  Imaging: X-rays left thumb: Fracture is in anatomic alignment, extra-articular.  Assessment & Plan: 1.  Clinically healing 9 days status post left thumb laceration and tuft fracture -Bandage changed until wound is healed, probably 1-2 more weeks. -Tentatively schedule a 2-week visit for follow-up and x-rays if pain is not improving.  If he is doing well, he he can cancel the visit.     Procedures: No procedures performed  No notes on file     PMFS History: There are no problems to display for this patient.  Past Medical History:  Diagnosis Date  . CAD (coronary artery disease)    Mild plaque in LAD on coronary CTA January 2020  . Seasonal allergies     Family History  Problem Relation Age of Onset  . Heart attack Father 60  . Lymphoma Sister   . Other Sister        tumor around the heart  . Alzheimer's disease Mother     Past Surgical History:  Procedure Laterality Date  . SURGERY SCROTAL / TESTICULAR     Social History    Occupational History  . Occupation: Art gallery manager  Tobacco Use  . Smoking status: Never Smoker  . Smokeless tobacco: Never Used  Substance and Sexual Activity  . Alcohol use: Yes    Comment: occasionally  . Drug use: Never  . Sexual activity: Not on file    Comment: married

## 2021-03-22 DIAGNOSIS — Z0000 Encounter for general adult medical examination without abnormal findings: Secondary | ICD-10-CM | POA: Diagnosis not present

## 2021-03-22 DIAGNOSIS — J302 Other seasonal allergic rhinitis: Secondary | ICD-10-CM | POA: Diagnosis not present

## 2021-03-22 DIAGNOSIS — Z1211 Encounter for screening for malignant neoplasm of colon: Secondary | ICD-10-CM | POA: Diagnosis not present

## 2021-03-22 DIAGNOSIS — E7800 Pure hypercholesterolemia, unspecified: Secondary | ICD-10-CM | POA: Diagnosis not present

## 2021-03-22 DIAGNOSIS — Z125 Encounter for screening for malignant neoplasm of prostate: Secondary | ICD-10-CM | POA: Diagnosis not present
